# Patient Record
Sex: Male | Born: 2000 | Hispanic: Yes | Marital: Single | Smoking: Never smoker
Health system: Southern US, Community
[De-identification: ages and names within clinical notes are randomized; demographics above are authoritative.]

---

## 2018-06-18 ENCOUNTER — Encounter: Payer: Self-pay | Admitting: Emergency Medicine

## 2018-06-18 ENCOUNTER — Emergency Department: Payer: Medicaid Other

## 2018-06-18 ENCOUNTER — Emergency Department
Admission: EM | Admit: 2018-06-18 | Discharge: 2018-06-18 | Disposition: A | Discharge status: Home / Self Care | Payer: Medicaid Other | Attending: Emergency Medicine | Admitting: Emergency Medicine

## 2018-06-18 DIAGNOSIS — W01198A Fall on same level from slipping, tripping and stumbling with subsequent striking against other object, initial encounter: Secondary | ICD-10-CM | POA: Insufficient documentation

## 2018-06-18 DIAGNOSIS — Y929 Unspecified place or not applicable: Secondary | ICD-10-CM | POA: Insufficient documentation

## 2018-06-18 DIAGNOSIS — S62515A Nondisplaced fracture of proximal phalanx of left thumb, initial encounter for closed fracture: Secondary | ICD-10-CM | POA: Insufficient documentation

## 2018-06-18 DIAGNOSIS — Y939 Activity, unspecified: Secondary | ICD-10-CM | POA: Insufficient documentation

## 2018-06-18 DIAGNOSIS — Y999 Unspecified external cause status: Secondary | ICD-10-CM | POA: Diagnosis not present

## 2018-06-18 DIAGNOSIS — S6992XA Unspecified injury of left wrist, hand and finger(s), initial encounter: Secondary | ICD-10-CM | POA: Diagnosis present

## 2018-06-18 MED ORDER — OXYCODONE-ACETAMINOPHEN 5-325 MG PO TABS
1.0000 | ORAL_TABLET | Freq: Once | ORAL | Status: AC
  Administered 2018-06-18: 1 via ORAL
  Filled 2018-06-18: qty 1

## 2018-06-18 MED ORDER — HYDROCODONE-ACETAMINOPHEN 5-325 MG PO TABS: 1.0000 | ORAL_TABLET | ORAL | 0 refills | Status: AC | PRN

## 2018-06-18 MED ORDER — MELOXICAM 15 MG PO TABS: 15.0000 mg | ORAL_TABLET | Freq: Every day | ORAL | 0 refills | Status: DC

## 2018-06-18 NOTE — ED Notes (Signed)
Pt states that he was joking around with friends when he tripped and landed on his left thumb and injured it. Family at bedside. Pt sitting calm with ice pack

## 2018-06-18 NOTE — ED Triage Notes (Signed)
Pt reports he fell onto the left hand and now is having pain and deformity seen to the left thumb.

## 2018-06-18 NOTE — ED Provider Notes (Signed)
[facility] Emergency Department Provider Note  ____________________________________________  Time seen: Approximately 10:54 PM  I have reviewed the triage vital signs and the nursing notes.   HISTORY  Chief Complaint Fall and Finger Injury    HPI Aaron Hunter is a 17 y.o. male who presents the emergency department complaining of the left thumb injury.  Patient reports that he tripped, fell landed on his thumb.  He reports that he hyperextended same and has been unable to move due to pain.  Patient did not hit his head or lose consciousness.  No other injury or complaint.  No medications for this complaint prior to arrival.  Patient has had sharp pain to the base of the thumb.    History reviewed. No pertinent past medical history.  There are no active problems to display for this patient.   History reviewed. No pertinent surgical history.  Prior to Admission medications   Medication Sig Start Date End Date Taking? Authorizing Provider  HYDROcodone-acetaminophen (NORCO/VICODIN) 5-325 MG tablet Take 1 tablet by mouth every 4 (four) hours as needed for moderate pain. 06/18/18   Vaidehi Braddy, Delorise RoyalsJonathan D, PA-C  meloxicam (MOBIC) 15 MG tablet Take 1 tablet (15 mg total) by mouth daily. 06/18/18   Shae Augello, Delorise RoyalsJonathan D, PA-C    Allergies Patient has no known allergies.  No family history on file.  Social History Social History   Tobacco Use  . Smoking status: Never Smoker  . Smokeless tobacco: Never Used  Substance Use Topics  . Alcohol use: Not on file  . Drug use: Not on file     Review of Systems  Constitutional: No fever/chills Eyes: No visual changes.  Cardiovascular: no chest pain. Respiratory: no cough. No SOB. Gastrointestinal: No abdominal pain.  No nausea, no vomiting.  Musculoskeletal: Positive for left thumb injury Skin: Negative for rash, abrasions, lacerations, ecchymosis. Neurological: Negative for headaches, focal weakness or  numbness. 10-point ROS otherwise negative.  ____________________________________________   PHYSICAL EXAM:  VITAL SIGNS: ED Triage Vitals  Enc Vitals Group     BP 06/18/18 2147 129/72     Pulse Rate 06/18/18 2147 70     Resp 06/18/18 2147 20     Temp 06/18/18 2147 97.9 F (36.6 C)     Temp Source 06/18/18 2147 Oral     SpO2 06/18/18 2147 100 %     Weight 06/18/18 2147 150 lb (68 kg)     Height 06/18/18 2147 5\' 8"  (1.727 m)     Head Circumference --      Peak Flow --      Pain Score 06/18/18 2250 8     Pain Loc --      Pain Edu? --      Excl. in GC? --      Constitutional: Alert and oriented. Well appearing and in no acute distress. Eyes: Conjunctivae are normal. PERRL. EOMI. Head: Atraumatic. Neck: No stridor.    Cardiovascular: Normal rate, regular rhythm. Normal S1 and S2.  Good peripheral circulation. Respiratory: Normal respiratory effort without tachypnea or retractions. Lungs CTAB. Good air entry to the bases with no decreased or absent breath sounds. Musculoskeletal: Full range of motion to all extremities. No gross deformities appreciated.  Visualization of the left hand reveals edema at the base of the left thumb.  No gross deformity.  Patient is able to extend and flex the distal phalanx appropriately.  Sensation and capillary refill intact.  Patient has no other tenderness to palpation over the  osseous structures of the left hand. Neurologic:  Normal speech and language. No gross focal neurologic deficits are appreciated.  Skin:  Skin is warm, dry and intact. No rash noted. Psychiatric: Mood and affect are normal. Speech and behavior are normal. Patient exhibits appropriate insight and judgement.   ____________________________________________   LABS (all labs ordered are listed, but only abnormal results are displayed)  Labs Reviewed - No data to  display ____________________________________________  EKG   ____________________________________________  RADIOLOGY I personally viewed and evaluated these images as part of my medical decision making, as well as reviewing the written report by the radiologist.  Dg Hand Complete Left  Result Date: 06/18/2018 CLINICAL DATA:  Left thumb pain after trauma. EXAM: LEFT HAND - COMPLETE 3+ VIEW COMPARISON:  None. FINDINGS: An acute nondisplaced fracture at the base of the left first proximal phalanx is noted without intra-articular involvement. Mild soft tissue swelling is seen about the fracture. The remainder of the left hand and wrist are unremarkable. IMPRESSION: Nondisplaced closed fracture at the base of the left first proximal phalanx. No intra-articular involvement nor joint dislocation. Electronically Signed   By: Tollie Eth M.D.   On: 06/18/2018 22:09   Dg Finger Thumb Left  Result Date: 06/18/2018 CLINICAL DATA:  Left thumb pain after fall. EXAM: LEFT THUMB 2+V COMPARISON:  Same day hand radiographs. FINDINGS: Four targeted views of the left thumb are provided demonstrating a fracture at the base of the left first proximal phalanx. These images also demonstrates slight ulnar and dorsal angulation of the distal fracture fragment not apparent on the radiographs of the hand. No joint dislocation. Soft tissue swelling is seen. IMPRESSION: Ulnar and dorsal angulated fracture at the base of the left first proximal phalanx. No intra-articular extension of fracture. No joint dislocation. Electronically Signed   By: Tollie Eth M.D.   On: 06/18/2018 22:11    ____________________________________________    PROCEDURES  Procedure(s) performed:    .Splint Application Date/Time: 06/19/2018 12:02 AM Performed by: Racheal Patches, PA-C Authorized by: Racheal Patches, PA-C   Consent:    Consent obtained:  Verbal   Consent given by:  Patient   Risks discussed:  Pain and  swelling Pre-procedure details:    Sensation:  Normal Procedure details:    Laterality:  Left   Location:  Finger   Finger:  L thumb   Splint type:  Thumb spica   Supplies:  Cotton padding, Ortho-Glass and elastic bandage Post-procedure details:    Pain:  Improved   Sensation:  Normal   Patient tolerance of procedure:  Tolerated well, no immediate complications      Medications  oxyCODONE-acetaminophen (PERCOCET/ROXICET) 5-325 MG per tablet 1 tablet (1 tablet Oral Given 06/18/18 2317)     ____________________________________________   INITIAL IMPRESSION / ASSESSMENT AND PLAN / ED COURSE  Pertinent labs & imaging results that were available during my care of the patient were reviewed by me and considered in my medical decision making (see chart for details).  Review of the  [place] was performed in accordance of the [facility] prior to dispensing any controlled drugs.      Patient's diagnosis is consistent with thumb fracture.  Patient presents with injury to the left thumb.  Patient is neurovascularly intact.  Patient is able to extend and flex the digit.  X-ray reveals slightly angulated fracture to the proximal phalanx.  No other osseous abnormality to the left hand.  Thumb is splinted.. Patient will be discharged home with prescriptions  for meloxicam and Vicodin. Patient is to follow up with hand surgery as needed or otherwise directed. Patient is given ED precautions to return to the ED for any worsening or new symptoms.     ____________________________________________  FINAL CLINICAL IMPRESSION(S) / ED DIAGNOSES  Final diagnoses:  Closed nondisplaced fracture of proximal phalanx of left thumb, initial encounter      NEW MEDICATIONS STARTED DURING THIS VISIT:  ED Discharge Orders        Ordered    meloxicam (MOBIC) 15 MG tablet  Daily     06/18/18 2308    HYDROcodone-acetaminophen (NORCO/VICODIN) 5-325 MG tablet  Every 4 hours PRN     06/18/18 2308           This chart was dictated using voice recognition software/Dragon. Despite best efforts to proofread, errors can occur which can change the meaning. Any change was purely unintentional.    Racheal Patches, PA-C 06/19/18 0003    Minna Antis, MD 06/19/18 [phone]

## 2020-09-19 ENCOUNTER — Emergency Department: Payer: No Typology Code available for payment source

## 2020-09-19 ENCOUNTER — Other Ambulatory Visit: Payer: Self-pay

## 2020-09-19 ENCOUNTER — Emergency Department
Admission: EM | Admit: 2020-09-19 | Discharge: 2020-09-19 | Disposition: A | Discharge status: Home / Self Care | Payer: No Typology Code available for payment source | Attending: Emergency Medicine | Admitting: Emergency Medicine

## 2020-09-19 ENCOUNTER — Encounter: Payer: Self-pay | Admitting: *Deleted

## 2020-09-19 DIAGNOSIS — Y92410 Unspecified street and highway as the place of occurrence of the external cause: Secondary | ICD-10-CM | POA: Insufficient documentation

## 2020-09-19 DIAGNOSIS — M79642 Pain in left hand: Secondary | ICD-10-CM | POA: Insufficient documentation

## 2020-09-19 DIAGNOSIS — M436 Torticollis: Secondary | ICD-10-CM | POA: Diagnosis not present

## 2020-09-19 MED ORDER — MELOXICAM 15 MG PO TABS: 15.0000 mg | ORAL_TABLET | Freq: Every day | ORAL | 0 refills | Status: AC

## 2020-09-19 NOTE — ED Notes (Signed)
Pt ambulatory into lobby in NAD.
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
## 2020-09-19 NOTE — ED Notes (Signed)
Patient to xray via stretcher

## 2020-09-19 NOTE — Discharge Instructions (Signed)
Take Meloxicam as needed for pain and inflammation.  

## 2020-09-19 NOTE — ED Notes (Signed)
Returned to room.
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
## 2022-06-26 IMAGING — CR DG HAND COMPLETE 3+V*L*
3 series · 3 of 3 positions shown · non-contrast
Comparison: None.

CLINICAL DATA: Pain

EXAM:
LEFT HAND - COMPLETE 3+ VIEW

[hand ap]
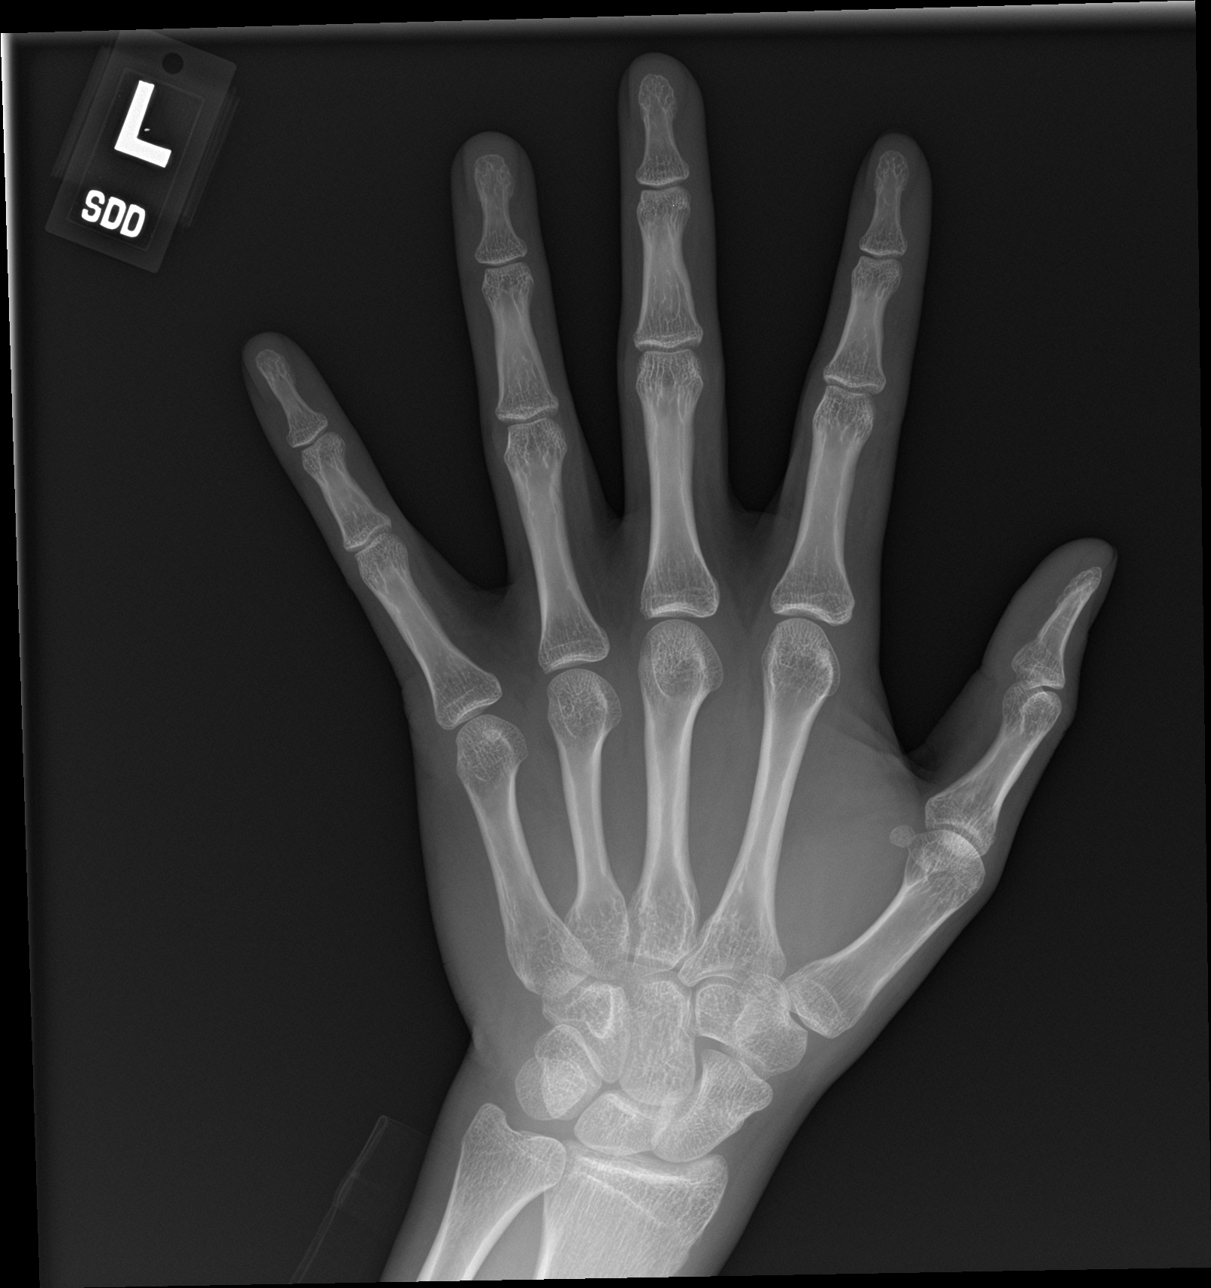

[hand obl]
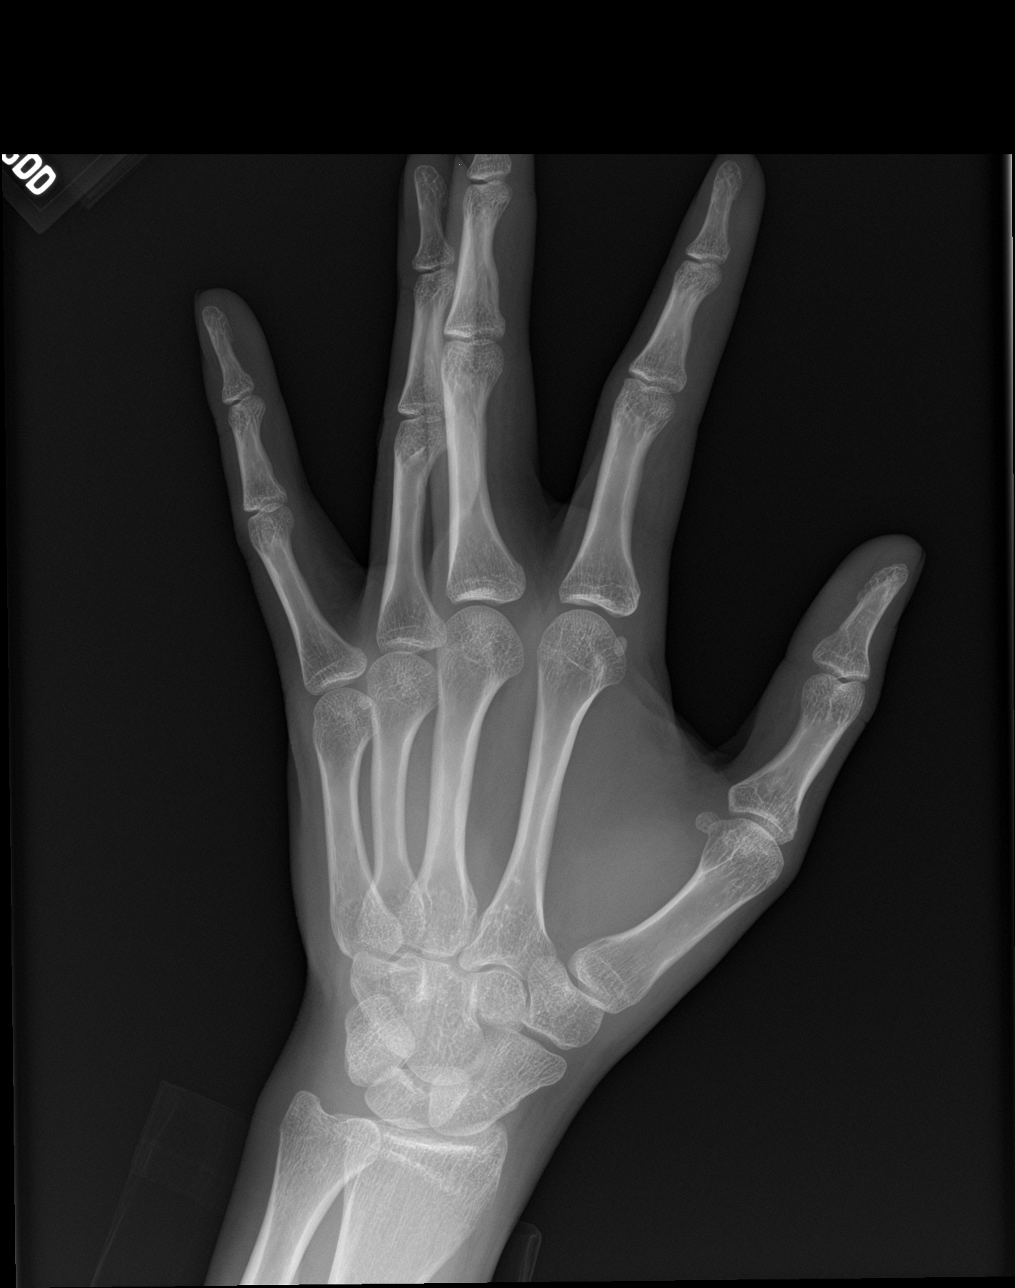

[hand lat]
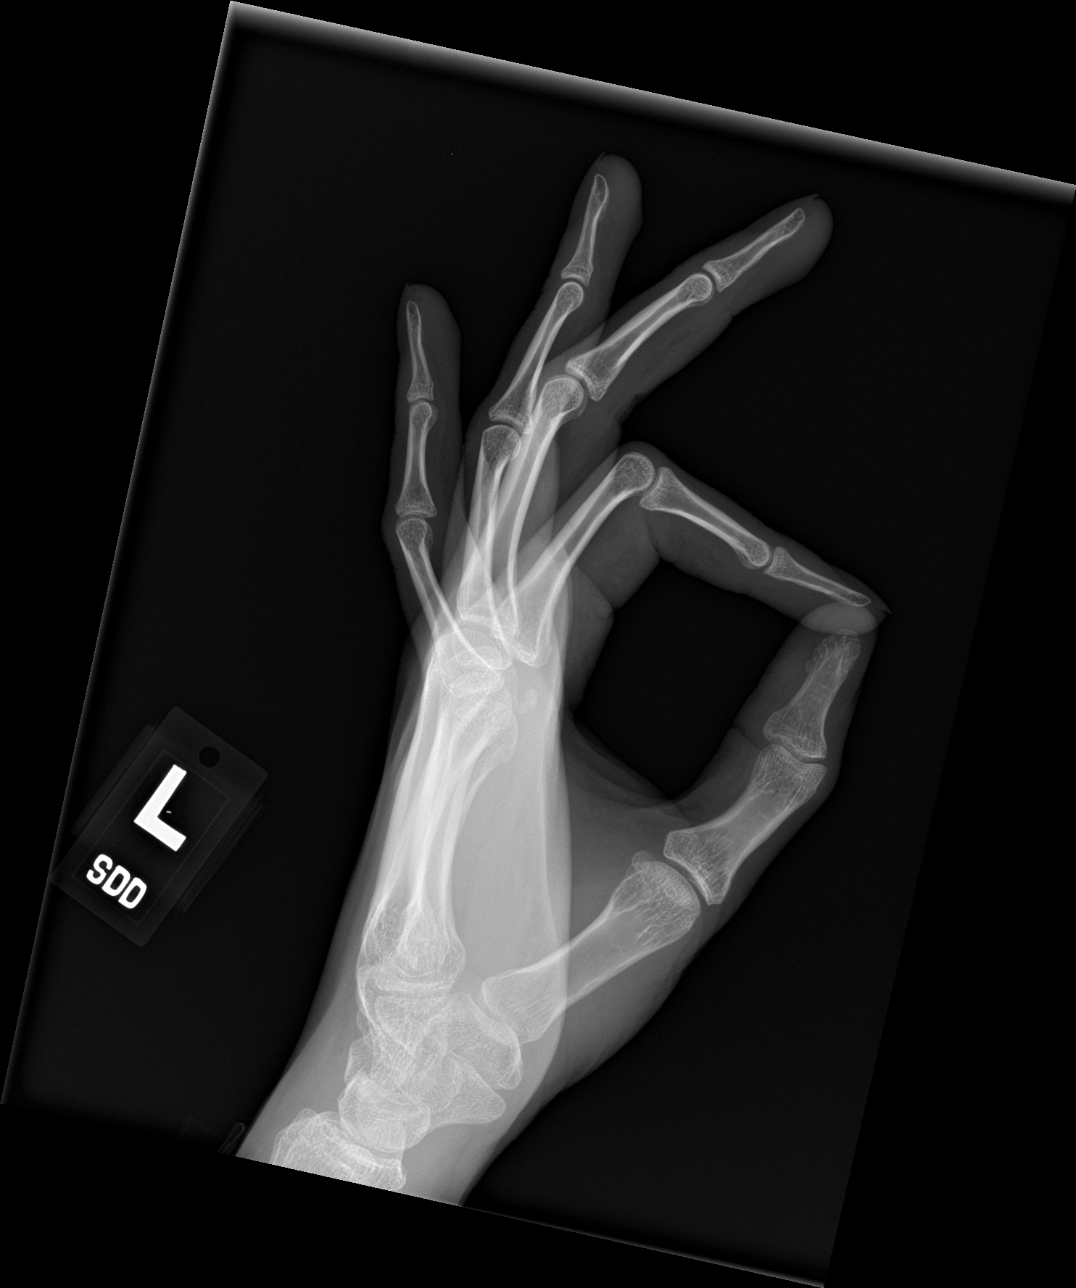

[3 of 3 positions shown; findings below may reference images not displayed]

FINDINGS: There is no evidence of fracture or dislocation. There is no
evidence of arthropathy or other focal bone abnormality. Soft
tissues are unremarkable.
IMPRESSION: Negative.
